# Patient Record
Sex: Male | Born: 1965 | Race: Black or African American | Hispanic: No | Marital: Single
Health system: Midwestern US, Community
[De-identification: ages and names within clinical notes are randomized; demographics above are authoritative.]

---

## 2004-02-25 ENCOUNTER — Emergency Department: Payer: Self-pay | Admitting: Emergency Medicine

## 2008-09-22 ENCOUNTER — Emergency Department: Payer: Self-pay | Admitting: Emergency Medicine

## 2014-01-11 LAB — CBC
HCT: 48.1 % (ref 40.0–52.0)
HGB: 15.3 g/dL (ref 13.0–18.0)
MCH: 29.3 pg (ref 26.0–34.0)
MCHC: 31.8 g/dL — ABNORMAL LOW (ref 32.0–36.0)
MCV: 92 fL (ref 80–100)
Platelet: 249 10*3/uL (ref 150–440)
RBC: 5.21 10*6/uL (ref 4.40–5.90)
RDW: 13.4 % (ref 11.5–14.5)
WBC: 4.5 10*3/uL (ref 3.8–10.6)

## 2014-01-11 LAB — URINALYSIS, COMPLETE
Bacteria: NONE SEEN
Bilirubin,UR: NEGATIVE
Blood: NEGATIVE
Glucose,UR: NEGATIVE mg/dL (ref 0–75)
Leukocyte Esterase: NEGATIVE
NITRITE: NEGATIVE
PH: 5 (ref 4.5–8.0)
Protein: NEGATIVE
RBC,UR: 1 /HPF (ref 0–5)
Specific Gravity: 1.026 (ref 1.003–1.030)
Squamous Epithelial: <1

## 2014-01-11 LAB — COMPREHENSIVE METABOLIC PANEL
ALBUMIN: 3.5 g/dL (ref 3.4–5.0)
ALK PHOS: 59 U/L
ALT: 39 U/L
AST: 36 U/L (ref 15–37)
Anion Gap: 8 (ref 7–16)
BUN: 9 mg/dL (ref 7–18)
Bilirubin,Total: 1.2 mg/dL — ABNORMAL HIGH (ref 0.2–1.0)
CALCIUM: 8.6 mg/dL (ref 8.5–10.1)
CREATININE: 1.21 mg/dL (ref 0.60–1.30)
Chloride: 100 mmol/L (ref 98–107)
Co2: 29 mmol/L (ref 21–32)
EGFR (African American): >60
EGFR (Non-African Amer.): >60
GLUCOSE: 172 mg/dL — AB (ref 65–99)
OSMOLALITY: 277 (ref 275–301)
POTASSIUM: 3.7 mmol/L (ref 3.5–5.1)
SODIUM: 137 mmol/L (ref 136–145)
TOTAL PROTEIN: 7.2 g/dL (ref 6.4–8.2)

## 2014-01-11 LAB — DRUG SCREEN, URINE
Amphetamines, Ur Screen: NEGATIVE (ref ?–1000)
BENZODIAZEPINE, UR SCRN: NEGATIVE (ref ?–200)
Barbiturates, Ur Screen: NEGATIVE (ref ?–200)
Cannabinoid 50 Ng, Ur ~~LOC~~: NEGATIVE (ref ?–50)
Cocaine Metabolite,Ur ~~LOC~~: POSITIVE (ref ?–300)
MDMA (Ecstasy)Ur Screen: NEGATIVE (ref ?–500)
Methadone, Ur Screen: NEGATIVE (ref ?–300)
Opiate, Ur Screen: NEGATIVE (ref ?–300)
PHENCYCLIDINE (PCP) UR S: NEGATIVE (ref ?–25)
TRICYCLIC, UR SCREEN: NEGATIVE (ref ?–1000)

## 2014-01-11 LAB — SALICYLATE LEVEL: Salicylates, Serum: <1.7 mg/dL

## 2014-01-11 LAB — ACETAMINOPHEN LEVEL: Acetaminophen: <2 ug/mL

## 2014-01-11 LAB — ETHANOL

## 2014-01-12 ENCOUNTER — Inpatient Hospital Stay: Payer: Self-pay | Admitting: Psychiatry

## 2014-07-28 NOTE — H&P (Signed)
PATIENT NAME:  Christopher [name], Christopher [name] MR#:  [id number] DATE OF BIRTH:  1965-08-11  DATE OF ADMISSION:  01/12/2014  DATE OF ASSESSMENT: 01/13/2014  IDENTIFYING INFORMATION AND CHIEF COMPLAINT: See consultation note from yesterday. This is a 49 year old male with a history of cocaine abuse and mood symptoms who came to the Emergency Room reporting suicidal ideation.   CHIEF COMPLAINT: "I got to get off that stuff."   HISTORY OF PRESENT ILLNESS: The patient has been having suicidal thoughts, feeling very negative about himself, not sleeping or eating well. He has been abusing alcohol and cocaine in an escalating pattern. Feels out of control and feels very irresponsible regarding his family. He is smoking up all of his family's money in cocaine, has been drinking beer heavily for the last month or so. This is a relapse after having an extended period of sobriety, but he has already lost his job and his wife is either leaving him already or threatening to leave him. He has been losing weight, poor self-care, suicidal thoughts, recent hallucinations while intoxicated.   PAST PSYCHIATRIC HISTORY: No history of psychiatric hospitalization, suicide attempts, or violence. History of cocaine dependence in the past as well as alcohol abuse, with some extended sobriety. Not on any psychiatric medication now or in the past.   PAST MEDICAL HISTORY: Denies any significant ongoing medical problems. No high blood pressure or heart disease or diabetes.   CURRENT MEDICATIONS: None.   ALLERGIES: No known drug allergies.   SOCIAL HISTORY: Married, with 2 young children. One child lives at home. The patient has been working in a [employer] but lost his job over his substance abuse and how it affected his performance. Serious financial problems. Wife is giving him ultimatums about treatment.   FAMILY HISTORY: No known family history of mental illness.   REVIEW OF SYSTEMS: Depressed mood, fatigued, tired. Negative thoughts  about himself. Physically otherwise stable. No other positive review of systems.   MENTAL STATUS EXAMINATION: Adequately groomed man. Passively cooperative. Passive interaction with the exam. Eye contact intermittent. Psychomotor activity sluggish and slow. Speech decreased in amount and quiet. Affect blunted. Mood stated as okay. Thoughts are lucid without loosening of associations. No evidence of delusions. Denies auditory or visual hallucinations. Passive recent suicidal thoughts. No intent or plan. No homicidal ideation. Alert and oriented x 4. Remembers 2 out of 3 objects at 3 minutes. Judgment and insight adequate.   LABORATORY RESULTS: Drug screen positive for cocaine. Chemistry panel shows an elevated glucose at 172. Alcohol level negative.   PHYSICAL EXAMINATION: GENERAL: The patient appears in no acute distress.  SKIN: No skin lesions identified.  HEENT: Pupils equal and reactive. Face symmetric. EXTREMITIES: Able to use all extremities appropriately. Normal gait. NEUROLOGIC: Strength and reflexes symmetric. Cranial nerves symmetric and normal.  LUNGS: Clear with no wheezes.  HEART: Regular rate and rhythm.  ABDOMEN: Soft, nontender. Normal bowel sounds.  VITAL SIGNS: Blood pressure 101/58, respirations 18, pulse 83, temperature 97.8.   ASSESSMENT: This is a 49 year old male with cocaine and alcohol abuse, presenting needing detoxification from alcohol; also with suicidal ideation. So far, there is no sign of any seizures or delirium. He is cooperative with treatment but remains passive. Mood is starting to get a little bit better.   TREATMENT PLAN: Continue current [facility] protocol and education and encourage attendance in groups. No change to current medicine.   DIAGNOSIS, PRINCIPAL AND PRIMARY: AXIS I: Substance-induced mood disorder, depressed.   SECONDARY DIAGNOSES:  AXIS I: 1.  Cocaine abuse.  2.  Alcohol abuse.  AXIS II: No diagnosis.  AXIS III: No diagnosis.     ____________________________ Audery Amel, MD jtc:ST [name]: 01/13/2014 22:14:15 ET T: 01/13/2014 23:00:05 ET JOB#: [id number]  cc: Audery Amel, MD, <Dictator> Audery Amel MD ELECTRONICALLY SIGNED 01/15/2014 0:25

## 2014-07-28 NOTE — Consult Note (Signed)
PATIENT NAME:  Christopher [name], Christopher [name] MR#:  [id number] DATE OF BIRTH:  September 06, 1965  DATE OF CONSULTATION:  01/12/2014  REFERRING PHYSICIAN:   CONSULTING PHYSICIAN:  Audery AmelJohn T. Riku Buttery, MD  IDENTIFYING INFORMATION AND REASON FOR CONSULTATION: This is a 49 year old man who has a history of cocaine abuse who was brought here under involuntary petition alleging suicidal ideation and out of control behavior.   CHIEF COMPLAINT: "I got to get off that stuff."   HISTORY OF PRESENT ILLNESS: Information obtained from the patient and the chart. Commitment petition states that he has been having suicidal thoughts, abusing alcohol and cocaine, not eating and not sleeping, behaving out of control, irresponsible and not taking care of self and family. The patient basically confirms all this. Says he has been using cocaine, smoking it, using up all of his money. Has been drinking at least a 40 ounce bottle of beer a day, probably more. All this has been going on for a few weeks. He said he had been sober up until last month when he relapsed by starting to use some wine. That quickly escalated and now he is in the situation that he is currently. He quit going to his job and lost his job. Has spent up all the money. Wife either has already left or is threatening to leave him. The patient admits that his mood has been sad and depressed. He does not sleep well at night, does not eat well, has lost weight, has had passive suicidal thoughts, no homicidal ideation. Had hallucinations while intoxicated.   PAST PSYCHIATRIC HISTORY: No history of psychiatric hospitalization. No history of suicide attempts or violence. Has had suicidal ideation in the past. Does have a history of cocaine dependence. York SpanielSaid that he stopped 3 years ago and stayed sober from alcohol and cocaine, but just recently relapsed. Not been on any psychiatric medicine.   PAST MEDICAL HISTORY: Denies any significant medical problems. No high blood pressure. No heart  disease. No diabetes.   MEDICATIONS: None.   ALLERGIES: No known drug allergies.   SOCIAL HISTORY: The patient is married. Has 2 young children, 1 by another relationship, but a 48-year-old daughter who does live at home. The patient works in a [employer] but quit going to his job recently and has now lost his job. Serious financial straights. Wife is concerned enough to have initiated the petition and to have called in wanting to ensure that we do something for her husband, but it is not clear whether she still staying with him.   FAMILY HISTORY: He denies having any family history at all of mental health problems.   REVIEW OF SYSTEMS: Depressed mood. Passive suicidal ideation. Fatigue. Poor appetite. No pain. The rest of the nine-point physical review of systems is negative. No current hallucinations.   MENTAL STATUS EXAMINATION: Adequately groomed gentleman who looks his stated age. Very passively cooperative with the interview. Rarely made any eye contact and did not sit up out of bed. Speech is quiet, almost whispered, minimal in amount. Affect flat. Mood stated as not so good. Thoughts are slow, some blocking. No bizarre thinking. No evidence of delusions or hallucinations. Denies auditory or visual hallucinations currently. Denies any homicidal ideation, passive suicidal ideation, no intent or plan. Could remember 3/3 objects immediately and at 3 minutes, was alert and oriented x4. Judgment and insight currently okay, obviously not while he is intoxicated. Normal fund of knowledge.   LABORATORY RESULTS: Drug screen positive for cocaine. Chemistry panel: Elevated glucose  on a nonfasting draw, 172. Alcohol level negative. Hematology all normal. Urinalysis normal. No glucose spilling, 1+ ketones.   VITAL SIGNS: Current blood pressure is 101/58, respirations 18, pulse 83, and temperature 97.8. No facial abnormalities or skin lesions noted.   ASSESSMENT: A 49 year old man with cocaine and  alcohol abuse, presents to the hospital needing detox. There is concern about danger from alcohol detox but also concern about recent mood with suicidal ideation and dangerous behavior towards himself and what could be dangerous to the family. I think he does meet criteria for hospitalization for stabilization.   TREATMENT PLAN: Admit to the hospital. He is fully agreeable. P.r.n. medicines for anxiety and sleep. Monitor vital signs. Try to engage him in education about mood problems and substance abuse. Re-evaluate once he starts to sober up a little bit more. Discuss follow-up options for substance abuse treatment.   DIAGNOSIS, PRINCIPAL AND PRIMARY:  AXIS I: Substance-induced mood disorder, depressed.   SECONDARY DIAGNOSES: AXIS I:  1.  Cocaine abuse.  2.  Alcohol abuse.  AXIS II: No diagnosis.  ____________________________ Audery Amel, MD jtc:sb [name]: 01/12/2014 14:47:33 ET T: 01/12/2014 15:03:43 ET JOB#: [id number]  cc: Audery Amel, MD, <Dictator> Audery Amel MD ELECTRONICALLY SIGNED 01/15/2014 0:24

## 2015-06-09 ENCOUNTER — Emergency Department: Payer: Self-pay

## 2015-06-09 ENCOUNTER — Emergency Department
Admission: EM | Admit: 2015-06-09 | Discharge: 2015-06-09 | Disposition: A | Discharge status: Home / Self Care | Payer: Self-pay | Attending: Emergency Medicine | Admitting: Emergency Medicine

## 2015-06-09 DIAGNOSIS — R1031 Right lower quadrant pain: Secondary | ICD-10-CM | POA: Insufficient documentation

## 2015-06-09 LAB — CBC WITH DIFFERENTIAL/PLATELET
BASOS PCT: 1 %
Basophils Absolute: 0 10*3/uL (ref 0–0.1)
EOS ABS: 0 10*3/uL (ref 0–0.7)
EOS PCT: 1 %
HCT: 47 % (ref 40.0–52.0)
Hemoglobin: 15.7 g/dL (ref 13.0–18.0)
Lymphocytes Relative: 43 %
Lymphs Abs: 1.1 10*3/uL (ref 1.0–3.6)
MCH: 30.1 pg (ref 26.0–34.0)
MCHC: 33.5 g/dL (ref 32.0–36.0)
MCV: 90 fL (ref 80.0–100.0)
Monocytes Absolute: 0.5 10*3/uL (ref 0.2–1.0)
Monocytes Relative: 20 %
NEUTROS PCT: 35 %
Neutro Abs: 0.9 10*3/uL — ABNORMAL LOW (ref 1.4–6.5)
PLATELETS: 122 10*3/uL — AB (ref 150–440)
RBC: 5.22 MIL/uL (ref 4.40–5.90)
RDW: 12.8 % (ref 11.5–14.5)
WBC: 2.6 10*3/uL — AB (ref 3.8–10.6)

## 2015-06-09 LAB — URINALYSIS COMPLETE WITH MICROSCOPIC (ARMC ONLY)
Bacteria, UA: NONE SEEN
Bilirubin Urine: NEGATIVE
GLUCOSE, UA: NEGATIVE mg/dL
Ketones, ur: NEGATIVE mg/dL
Leukocytes, UA: NEGATIVE
Nitrite: NEGATIVE
Protein, ur: 30 mg/dL — AB
Specific Gravity, Urine: 1.028 (ref 1.005–1.030)
pH: 5 (ref 5.0–8.0)

## 2015-06-09 LAB — BASIC METABOLIC PANEL
ANION GAP: 6 (ref 5–15)
BUN: 10 mg/dL (ref 6–20)
CALCIUM: 8.4 mg/dL — AB (ref 8.9–10.3)
CO2: 29 mmol/L (ref 22–32)
Chloride: 99 mmol/L — ABNORMAL LOW (ref 101–111)
Creatinine, Ser: 1.16 mg/dL (ref 0.61–1.24)
GFR calc Af Amer: >60 mL/min (ref >60)
GLUCOSE: 103 mg/dL — AB (ref 65–99)
Potassium: 3.9 mmol/L (ref 3.5–5.1)
Sodium: 134 mmol/L — ABNORMAL LOW (ref 135–145)

## 2015-06-09 MED ORDER — IOHEXOL 240 MG/ML SOLN
25.0000 mL | Freq: Once | INTRAMUSCULAR | Status: AC | PRN
  Administered 2015-06-09: 25 mL via ORAL

## 2015-06-09 MED ORDER — SODIUM CHLORIDE 0.9 % IV BOLUS (SEPSIS)
1000.0000 mL | Freq: Once | INTRAVENOUS | Status: AC
  Administered 2015-06-09: 1000 mL via INTRAVENOUS

## 2015-06-09 MED ORDER — PIPERACILLIN-TAZOBACTAM 3.375 G IVPB 30 MIN
3.3750 g | Freq: Once | INTRAVENOUS | Status: AC
  Administered 2015-06-09: 3.375 g via INTRAVENOUS
  Filled 2015-06-09: qty 50

## 2015-06-09 MED ORDER — PROMETHAZINE HCL 25 MG PO TABS: 25.0000 mg | ORAL_TABLET | Freq: Four times a day (QID) | ORAL | Status: AC | PRN

## 2015-06-09 MED ORDER — IOHEXOL 300 MG/ML  SOLN
100.0000 mL | Freq: Once | INTRAMUSCULAR | Status: AC | PRN
  Administered 2015-06-09: 100 mL via INTRAVENOUS

## 2015-06-09 NOTE — Discharge Instructions (Signed)

## 2015-06-09 NOTE — ED Notes (Signed)
Pt reports fever x 1 week with pain in his right flank starting 3 days ago. Pt reports cough and congestion.

## 2015-06-09 NOTE — ED Provider Notes (Signed)
[facility] Emergency Department Provider Note  ____________________________________________  Time seen: ~1855  I have reviewed the triage vital signs and the nursing notes.   HISTORY  Chief Complaint Fever   History limited by: Not Limited   HPI TYMAR POLYAK is a 50 y.o. male who presents to the emergency department today because of concerns forfever, cough, right flank pain. Patient states he has had a fever for a week. It has been fairly persistent. Patient states that for the past few days he has also had right flank pain. He describes it as sharp. It does somewhat wax and wane. It has been accompanied by some diarrhea. He denies any nausea or vomiting. Furthermore the patient has had some coughing and congestion for the past couple of days. Patient denies any abdominal surgeries in the past.    No significant past medical history There are no active problems to display for this patient.   No history of abdominal surgery No current outpatient prescriptions on file.  Allergies Review of patient's allergies indicates no known allergies.  No family history on file.  Social History Has not smoked in three weeks Has not drunk alcohol in three weeks  Review of Systems  Constitutional: Positive for fever Cardiovascular: Negative for chest pain. Respiratory: Negative for shortness of breath. Gastrointestinal: Positive for right flank pain Neurological: Negative for headaches, focal weakness or numbness.  10-point ROS otherwise negative.  ____________________________________________   PHYSICAL EXAM:  VITAL SIGNS: ED Triage Vitals  Enc Vitals Group     BP 06/09/15 1632 109/92 mmHg     Pulse Rate 06/09/15 1632 88     Resp 06/09/15 1632 18     Temp 06/09/15 1632 100.3 F (37.9 C)     Temp src --      SpO2 06/09/15 1632 95 %     Weight --      Height --      Head Cir --      Peak Flow --      Pain Score 06/09/15 1633 10    Constitutional: Alert and oriented. Well appearing and in no distress. Eyes: Conjunctivae are normal. PERRL. Normal extraocular movements. ENT   Head: Normocephalic and atraumatic.   Nose: No congestion/rhinnorhea.   Mouth/Throat: Mucous membranes are moist.   Neck: No stridor. Hematological/Lymphatic/Immunilogical: No cervical lymphadenopathy. Cardiovascular: Normal rate, regular rhythm.  No murmurs, rubs, or gallops. Respiratory: Normal respiratory effort without tachypnea nor retractions. Breath sounds are clear and equal bilaterally. No wheezes/rales/rhonchi. Gastrointestinal: Soft. Tender to palpation in the right lower quadrant. Genitourinary: Deferred Musculoskeletal: Normal range of motion in all extremities. No joint effusions.  No lower extremity tenderness nor edema. Neurologic:  Normal speech and language. No gross focal neurologic deficits are appreciated.  Skin:  Skin is warm, dry and intact. No rash noted. Psychiatric: Mood and affect are normal. Speech and behavior are normal. Patient exhibits appropriate insight and judgment.  ____________________________________________    LABS (pertinent positives/negatives)  Labs Reviewed  CBC WITH DIFFERENTIAL/PLATELET - Abnormal; Notable for the following:    WBC 2.6 (*)    Platelets 122 (*)    Neutro Abs 0.9 (*)    All other components within normal limits  BASIC METABOLIC PANEL - Abnormal; Notable for the following:    Sodium 134 (*)    Chloride 99 (*)    Glucose, Bld 103 (*)    Calcium 8.4 (*)    All other components within normal limits  URINALYSIS COMPLETEWITH MICROSCOPIC (  [facility] ONLY) - Abnormal; Notable for the following:    Color, Urine YELLOW (*)    APPearance CLEAR (*)    Hgb urine dipstick 1+ (*)    Protein, ur 30 (*)    Squamous Epithelial / LPF 0-5 (*)    All other components within normal limits      ____________________________________________   EKG  None  ____________________________________________    RADIOLOGY  CXR IMPRESSION: No active cardiopulmonary disease.  ____________________________________________   PROCEDURES  Procedure(s) performed: None  Critical Care performed: No  ____________________________________________   INITIAL IMPRESSION / ASSESSMENT AND PLAN / ED COURSE  Pertinent labs & imaging results that were available during my care of the patient were reviewed by me and considered in my medical decision making (see chart for details).  Patient presented to the emergency department today because of concerns for continued cough and right lower quadrant abdominal pain. On exam patient was tender in the right lower quadrant. Patient's white blood cell count was quite elevated. He has however been on steroids. This point I do have some concern for possible appendicitis. Will obtain CT scan. Terms of the hemoptysis patient has had a thorough workup for this and a recent admission. If CT scan is negative for patient is safe for discharge with planned follow-up.  ____________________________________________   FINAL CLINICAL IMPRESSION(S) / ED DIAGNOSES  Final diagnoses:  RLQ abdominal pain     Phineas SemenGraydon Brentton Wardlow, MD 06/11/15 1507

## 2015-06-09 NOTE — ED Provider Notes (Signed)
Assume care of patient from Dr. Derrill KayGoodman. CT scan unremarkable. Patient's vital signs remain unremarkable except for a low-grade fever. Repeat abdominal exam shows he is completely nontender and asymptomatic.Marland Kitchen.  Patient is tolerating oral intake. Feeling well. Agreeable with discharge home. Most likely this is a viral syndrome with his recent cough and congestion and the subacute nature of the symptoms. I advised him that if his symptoms are not improving he should follow up with a primary care doctor in the next 1-2 days for further evaluation or return immediately if he becomes febrile vomiting or has worsening pain.  Sharman CheekPhillip Neka Bise, MD 06/09/15 [phone]

## 2018-09-17 ENCOUNTER — Emergency Department
Admission: EM | Admit: 2018-09-17 | Discharge: 2018-09-17 | Disposition: A | Discharge status: Home / Self Care | Payer: Self-pay | Attending: Emergency Medicine | Admitting: Emergency Medicine

## 2018-09-17 ENCOUNTER — Encounter: Payer: Self-pay | Admitting: Emergency Medicine

## 2018-09-17 ENCOUNTER — Other Ambulatory Visit: Payer: Self-pay

## 2018-09-17 ENCOUNTER — Emergency Department: Payer: Self-pay

## 2018-09-17 DIAGNOSIS — T07XXXA Unspecified multiple injuries, initial encounter: Secondary | ICD-10-CM

## 2018-09-17 DIAGNOSIS — Y929 Unspecified place or not applicable: Secondary | ICD-10-CM | POA: Insufficient documentation

## 2018-09-17 DIAGNOSIS — S022XXA Fracture of nasal bones, initial encounter for closed fracture: Secondary | ICD-10-CM | POA: Insufficient documentation

## 2018-09-17 DIAGNOSIS — Y999 Unspecified external cause status: Secondary | ICD-10-CM | POA: Insufficient documentation

## 2018-09-17 DIAGNOSIS — S0181XA Laceration without foreign body of other part of head, initial encounter: Secondary | ICD-10-CM

## 2018-09-17 DIAGNOSIS — S0121XA Laceration without foreign body of nose, initial encounter: Secondary | ICD-10-CM | POA: Insufficient documentation

## 2018-09-17 DIAGNOSIS — Y939 Activity, unspecified: Secondary | ICD-10-CM | POA: Insufficient documentation

## 2018-09-17 MED ORDER — AMOXICILLIN-POT CLAVULANATE 875-125 MG PO TABS: 1.0000 | ORAL_TABLET | Freq: Two times a day (BID) | ORAL | 0 refills | Status: AC
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
## 2018-09-17 NOTE — ED Notes (Signed)
Pt resting comfortably at this time. Pt still waiting for cab to get here to take pt home.

## 2018-09-17 NOTE — ED Notes (Signed)
Pt has no ride home. Pt awaiting cab to pick him up from ER lobby at this time.
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
## 2018-09-17 NOTE — ED Notes (Signed)
Pt ambulated with this RN to lobby to wait for cab. Pt is in NAD at time of D/C.
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
## 2018-09-17 NOTE — ED Notes (Signed)
Pt has obvious deformity to nose. Small laceration located on bridge of nose. Bleeding controlled. Pt is alert and oriented at this time.

## 2019-12-01 ENCOUNTER — Ambulatory Visit: Payer: Self-pay
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
## 2021-03-15 ENCOUNTER — Emergency Department
Admission: EM | Admit: 2021-03-15 | Discharge: 2021-03-15 | Disposition: A | Discharge status: Home / Self Care | Payer: Self-pay | Attending: Emergency Medicine | Admitting: Emergency Medicine

## 2021-03-15 ENCOUNTER — Other Ambulatory Visit: Payer: Self-pay

## 2021-03-15 DIAGNOSIS — B349 Viral infection, unspecified: Secondary | ICD-10-CM

## 2021-03-15 DIAGNOSIS — U071 COVID-19: Secondary | ICD-10-CM | POA: Insufficient documentation

## 2021-03-15 LAB — RESP PANEL BY RT-PCR (FLU A&B, COVID) ARPGX2
Influenza A by PCR: NEGATIVE
Influenza B by PCR: NEGATIVE
SARS Coronavirus 2 by RT PCR: NEGATIVE

## 2021-03-15 NOTE — ED Triage Notes (Signed)
Pt states he tested positive for flu 1 week ago and then tested positive for covid with a home test a couple days ago- pt trying to get cleared to go back to work

## 2021-03-15 NOTE — ED Provider Notes (Signed)
[facility] Emergency Department Provider Note ____________________________________________  Time seen: 1712  I have reviewed the triage vital signs and the nursing notes.  HISTORY  Chief Complaint  Influenza   HPI Christopher [name] is a 55 y.o. male presents to the ED for evaluation of symptoms related to a positive covid home test. He presents in no distress, requesting a RTW note for his employer. He denies any worrisome symptoms at this time.    History reviewed. No pertinent past medical history.  There are no problems to display for this patient.   History reviewed. No pertinent surgical history.  Prior to Admission medications   Medication Sig Start Date End Date Taking? Authorizing Provider  promethazine (PHENERGAN) 25 MG tablet Take 1 tablet (25 mg total) by mouth every 6 (six) hours as needed for nausea or vomiting. 06/09/15   [name], MD    Allergies Patient has no known allergies.  No family history on file.  Social History    Review of Systems  Constitutional: Negative for fever. Eyes: Negative for visual changes. ENT: Negative for sore throat. Cardiovascular: Negative for chest pain. Respiratory: Negative for shortness of breath. Gastrointestinal: Negative for abdominal pain, vomiting and diarrhea. Genitourinary: Negative for dysuria. Musculoskeletal: Negative for back pain. Skin: Negative for rash. Neurological: Negative for headaches, focal weakness or numbness. ____________________________________________  PHYSICAL EXAM:  VITAL SIGNS: ED Triage Vitals  Enc Vitals Group     BP 03/15/21 1450 139/65     Pulse Rate 03/15/21 1450 77     Resp 03/15/21 1450 18     Temp 03/15/21 1450 98.1 F (36.7 C)     Temp Source 03/15/21 1450 Oral     SpO2 03/15/21 1450 98 %     Weight 03/15/21 1451 200 lb (90.7 kg)     Height 03/15/21 1451 6\' 3"  (1.905 m)     Head Circumference --      Peak Flow --      Pain Score 03/15/21 1452 0      Pain Loc --      Pain Edu? --      Excl. in GC? --     Constitutional: Alert and oriented. Well appearing and in no distress. Head: Normocephalic and atraumatic. Eyes: Conjunctivae are normal. Normal extraocular movements Cardiovascular: Normal rate, regular rhythm. Normal distal pulses. Respiratory: Normal respiratory effort. No wheezes/rales/rhonchi. Musculoskeletal: Nontender with normal range of motion in all extremities.  Neurologic:  Normal gait without ataxia. Normal speech and language. No gross focal neurologic deficits are appreciated. Skin:  Skin is warm, dry and intact. No rash noted. Psychiatric: Mood and affect are normal. Patient exhibits appropriate insight and judgment. ____________________________________________    {LABS (pertinent positives/negatives) Labs Reviewed  RESP PANEL BY RT-PCR (FLU A&B, COVID) ARPGX2    ____________________________________________  {EKG  ____________________________________________   RADIOLOGY Official radiology report(s): No results found. ____________________________________________  PROCEDURES   Procedures ____________________________________________   INITIAL IMPRESSION / ASSESSMENT AND PLAN / ED COURSE  As part of my medical decision making, I reviewed the following data within the electronic MEDICAL RECORD NUMBER Labs reviewed pending and Notes from prior ED visits   DDX: covid, influenza, RSV, viral URI  Patient presents to the ED reporting a positive home covid test, and requesting a work note for his provider.  Patient is in stable condition at this time without signs of acute respite distress, dehydration, or toxic appearance.  He agreed to a viral panel swab which is pending  at time of disposition.  He was discharged with a work note returning him to work on Monday.  Patient will follow his test results on call MyChart.  He will treat his symptoms with OTC meds as needed.  Christopher [name] was evaluated in Emergency  Department on 03/15/2021 for the symptoms described in the history of present illness. He was evaluated in the context of the global COVID-19 pandemic, which necessitated consideration that the patient might be at risk for infection with the SARS-CoV-2 virus that causes COVID-19. Institutional protocols and algorithms that pertain to the evaluation of patients at risk for COVID-19 are in a state of rapid change based on information released by regulatory bodies including the CDC and federal and state organizations. These policies and algorithms were followed during the patient's care in the ED. ____________________________________________  FINAL CLINICAL IMPRESSION(S) / ED DIAGNOSES  Final diagnoses:  Viral illness  COVID-19      Lissa Hoard, PA-C 03/16/21 0003    [name], MD 03/19/21 2308

## 2021-03-15 NOTE — Discharge Instructions (Signed)
You have a COVID/flu test pending.  You can follow your results on call MyChart.  You should remain out of work until Tuesday of next week.  Continue to take over-the-counter medicines as needed for symptom relief.

## 2021-03-15 NOTE — ED Notes (Signed)
See triage. Pt stating was dx with the flu a week ago and took an at home COVID test that came back +. Pt in NAD.

## 2021-11-16 ENCOUNTER — Emergency Department: Admit: 2021-11-16 | Payer: PRIVATE HEALTH INSURANCE

## 2021-11-16 ENCOUNTER — Inpatient Hospital Stay
Admit: 2021-11-16 | Discharge: 2021-11-16 | Disposition: A | Discharge status: Home / Self Care | Payer: Auto Insurance (includes no fault) | Attending: Emergency Medicine

## 2021-11-16 DIAGNOSIS — S161XXA Strain of muscle, fascia and tendon at neck level, initial encounter: Secondary | ICD-10-CM

## 2021-11-16 MED ORDER — IBUPROFEN 400 MG PO TABS
400 MG | ORAL_TABLET | Freq: Four times a day (QID) | ORAL | 0 refills | Status: AC | PRN
Start: 2021-11-16 — End: ?

## 2021-11-16 MED ORDER — METHOCARBAMOL 750 MG PO TABS
750 MG | ORAL_TABLET | Freq: Four times a day (QID) | ORAL | 0 refills | Status: AC | PRN
Start: 2021-11-16 — End: 2021-11-26

## 2021-11-16 MED ORDER — LIDOCAINE 5 % EX PTCH
5 % | MEDICATED_PATCH | Freq: Every day | CUTANEOUS | 0 refills | Status: AC
Start: 2021-11-16 — End: 2021-11-26
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
## 2021-11-16 NOTE — Discharge Instructions (Signed)
Use ice to the injured area every 2-3 hours for the next 2 days.    Ibuprofen Robaxin as prescribed every 6 hours for pain.    Keep your scheduled follow-up in 2 days with your primary care physician for recheck.  As discussed if your symptoms do not improve you may need further evaluation including but not limited to an MRI.  Sometimes physical therapy is required if your pain does not resolve.    As discussed if your symptoms worsen or you have new symptoms of concern you can return for reevaluation.
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
